# Patient Record
Sex: Male | Born: 1980 | Race: Black or African American | Hispanic: No | Marital: Single | Smoking: Never smoker
Health system: Southern US, Community
[De-identification: ages and names within clinical notes are randomized; demographics above are authoritative.]

## PROBLEM LIST (undated history)

## (undated) HISTORY — PX: LEG SURGERY: SHX1003

---

## 2005-05-10 ENCOUNTER — Emergency Department (HOSPITAL_COMMUNITY): Admission: EM | Admit: 2005-05-10 | Discharge: 2005-05-10 | Payer: Self-pay | Admitting: Emergency Medicine

## 2006-10-31 ENCOUNTER — Emergency Department (HOSPITAL_COMMUNITY): Admission: EM | Admit: 2006-10-31 | Discharge: 2006-10-31 | Payer: Self-pay | Admitting: Emergency Medicine

## 2007-03-15 ENCOUNTER — Inpatient Hospital Stay (HOSPITAL_COMMUNITY): Admission: EM | Admit: 2007-03-15 | Discharge: 2007-03-17 | Payer: Self-pay

## 2008-07-26 ENCOUNTER — Emergency Department (HOSPITAL_COMMUNITY): Admission: EM | Admit: 2008-07-26 | Discharge: 2008-07-26 | Payer: Self-pay | Admitting: Family Medicine
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
## 2010-10-02 LAB — RAPID URINE DRUG SCREEN, HOSP PERFORMED
Amphetamines: NOT DETECTED
Barbiturates: NOT DETECTED
Benzodiazepines: NOT DETECTED
Cocaine: NOT DETECTED

## 2010-10-02 LAB — I-STAT 8, (EC8 V) (CONVERTED LAB)
Acid-base deficit: 1
BUN: 12
Chloride: 106
HCT: 45
Operator id: 133351
Sodium: 140
TCO2: 25
pCO2, Ven: 38.9 — ABNORMAL LOW

## 2010-10-02 LAB — DIFFERENTIAL
Basophils Absolute: 0
Basophils Relative: 0
Eosinophils Absolute: 0
Monocytes Relative: 5

## 2010-10-02 LAB — PROTIME-INR: Prothrombin Time: 13.2

## 2010-10-02 LAB — CBC
HCT: 41.2
MCHC: 33.9
RDW: 13.8

## 2010-10-02 LAB — SAMPLE TO BLOOD BANK

## 2010-10-02 LAB — POCT I-STAT CREATININE: Creatinine, Ser: 1.2

## 2012-07-30 ENCOUNTER — Emergency Department (HOSPITAL_COMMUNITY): Payer: Self-pay

## 2012-07-30 ENCOUNTER — Emergency Department (INDEPENDENT_AMBULATORY_CARE_PROVIDER_SITE_OTHER)
Admission: EM | Admit: 2012-07-30 | Discharge: 2012-07-30 | Disposition: A | Discharge status: Home / Self Care | Payer: Self-pay | Source: Home / Self Care | Attending: Family Medicine | Admitting: Family Medicine

## 2012-07-30 ENCOUNTER — Encounter (HOSPITAL_COMMUNITY): Payer: Self-pay | Admitting: Emergency Medicine

## 2012-07-30 DIAGNOSIS — R0789 Other chest pain: Secondary | ICD-10-CM

## 2012-07-30 LAB — CBC WITH DIFFERENTIAL/PLATELET
Eosinophils Absolute: 0.3 10*3/uL (ref 0.0–0.7)
Hemoglobin: 14.5 g/dL (ref 13.0–17.0)
Lymphocytes Relative: 18 % (ref 12–46)
MCH: 27.2 pg (ref 26.0–34.0)
Monocytes Relative: 6 % (ref 3–12)
Neutro Abs: 5.8 10*3/uL (ref 1.7–7.7)
RBC: 5.34 MIL/uL (ref 4.22–5.81)

## 2012-07-30 MED ORDER — METHYLPREDNISOLONE 4 MG PO KIT: PACK | ORAL | Status: DC

## 2012-07-30 MED ORDER — HYDROCOD POLST-CHLORPHEN POLST 10-8 MG/5ML PO LQCR: 5.0000 mL | Freq: Two times a day (BID) | ORAL | Status: DC | PRN

## 2012-07-30 NOTE — ED Notes (Signed)
Patient states he has had chest pain since yesterday.  Admits to having SOB.   Denies any pain radiating.  No treatment tried.

## 2012-07-30 NOTE — ED Provider Notes (Signed)
Medical screening examination/treatment/procedure(s) were performed by resident physician or non-physician practitioner and as supervising physician I was immediately available for consultation/collaboration.   KINDL,JAMES DOUGLAS MD.   James D Kindl, MD 07/30/12 2031 

## 2012-07-30 NOTE — ED Provider Notes (Signed)
History    CSN: [id number] Arrival date & time 07/30/12  1530  First MD Initiated Contact with Patient 07/30/12 1620     Chief Complaint  Patient presents with  . Chest Pain   (Consider location/radiation/quality/duration/timing/severity/associated sxs/prior Treatment) Patient is a 32 y.o. male presenting with chest pain.  Chest Pain Associated symptoms: cough and shortness of breath   Associated symptoms: no diaphoresis, no fatigue and no fever       32 yo bm presents with a 2 day hx of cough, sob, and chest pain.  Denies fever, chills, headache sore throat, nausea, vomiting.  Nonproductive cough started first, then the chest pain.  No hx of asthma or cardiac issues.  Smokes recreational marijuana.   Symptoms not made worse with exertion.   History reviewed. No pertinent past medical history. No past surgical history on file. No family history on file. History  Substance Use Topics  . Smoking status: Not on file  . Smokeless tobacco: Not on file  . Alcohol Use: Not on file    Review of Systems  Constitutional: Negative for fever, chills, diaphoresis, activity change, appetite change and fatigue.  HENT: Negative.   Eyes: Negative.   Respiratory: Positive for cough, chest tightness and shortness of breath. Negative for wheezing and stridor.   Cardiovascular: Positive for chest pain.  Gastrointestinal: Negative.   Endocrine: Negative.   Genitourinary: Negative.   Musculoskeletal: Positive for myalgias (chest).  Skin: Negative.   Allergic/Immunologic: Negative.   Hematological: Negative.   Psychiatric/Behavioral: Negative.     Allergies  Review of patient's allergies indicates no known allergies.  Home Medications   Current Outpatient Rx  Name  Route  Sig  Dispense  Refill  . chlorpheniramine-HYDROcodone (TUSSIONEX PENNKINETIC ER) 10-8 MG/5ML LQCR   Oral   Take 5 mLs by mouth every 12 (twelve) hours as needed (cough).   140 mL   0   . methylPREDNISolone (MEDROL  DOSEPAK) 4 MG tablet      follow package directions   21 tablet   0    BP 125/77  Pulse 71  Temp(Src) 98 F (36.7 C) (Oral)  Resp 20  SpO2 96% Physical Exam  Constitutional: He is oriented to person, place, and time. He appears well-developed and well-nourished.  HENT:  Head: Normocephalic and atraumatic.  Eyes: Pupils are equal, round, and reactive to light.  Neck: Normal range of motion.  Cardiovascular: Normal rate, regular rhythm and normal heart sounds.   Pulmonary/Chest: Effort normal and breath sounds normal. No respiratory distress. He has no wheezes. He has no rales. He exhibits tenderness (sternal border and bilal pectoralis ).  Musculoskeletal: Normal range of motion.  Neurological: He is alert and oriented to person, place, and time.  Skin: Skin is warm and dry.  Psychiatric: He has a normal mood and affect.    ED Course  Procedures (including critical care time) Labs Reviewed  CBC WITH DIFFERENTIAL   Dg Chest 2 View  07/30/2012   *RADIOLOGY REPORT*  Clinical Data: Chest tightness and pain.  CHEST - 2 VIEW  Comparison: 10/31/2006 for  Findings: Trachea is midline.  Heart size stable.  There may be minimal scarring at the right lung base.  Lungs are otherwise clear.  No pleural fluid.  IMPRESSION: No acute findings.   Original Report Authenticated By: Leanna Battles, M.D.   1. Chest pain, muscular         Possible bronchitis  MDM  Patient given scripts for tussionex and medrol  dosepack to take as directed.  If symptoms not better or worsen within the next 5-7 days he will go to the ED for further evaluation.  Voices understanding.  All questions answered.  Reviewed with dr Artis Flock.    Meds ordered this encounter  Medications  . chlorpheniramine-HYDROcodone (TUSSIONEX PENNKINETIC ER) 10-8 MG/5ML LQCR    Sig: Take 5 mLs by mouth every 12 (twelve) hours as needed (cough).    Dispense:  140 mL    Refill:  0  . methylPREDNISolone (MEDROL DOSEPAK) 4 MG tablet     Sig: follow package directions    Dispense:  21 tablet    Refill:  0    Zonia Kief, PA-C 07/30/12 2015

## 2014-10-04 IMAGING — CR DG CHEST 2V
3 series · 3 of 3 positions shown · non-contrast
Comparison: 10/31/2006 for

CLINICAL DATA: Chest tightness and pain.

CHEST - 2 VIEW

[view not recorded (1 of 3)]
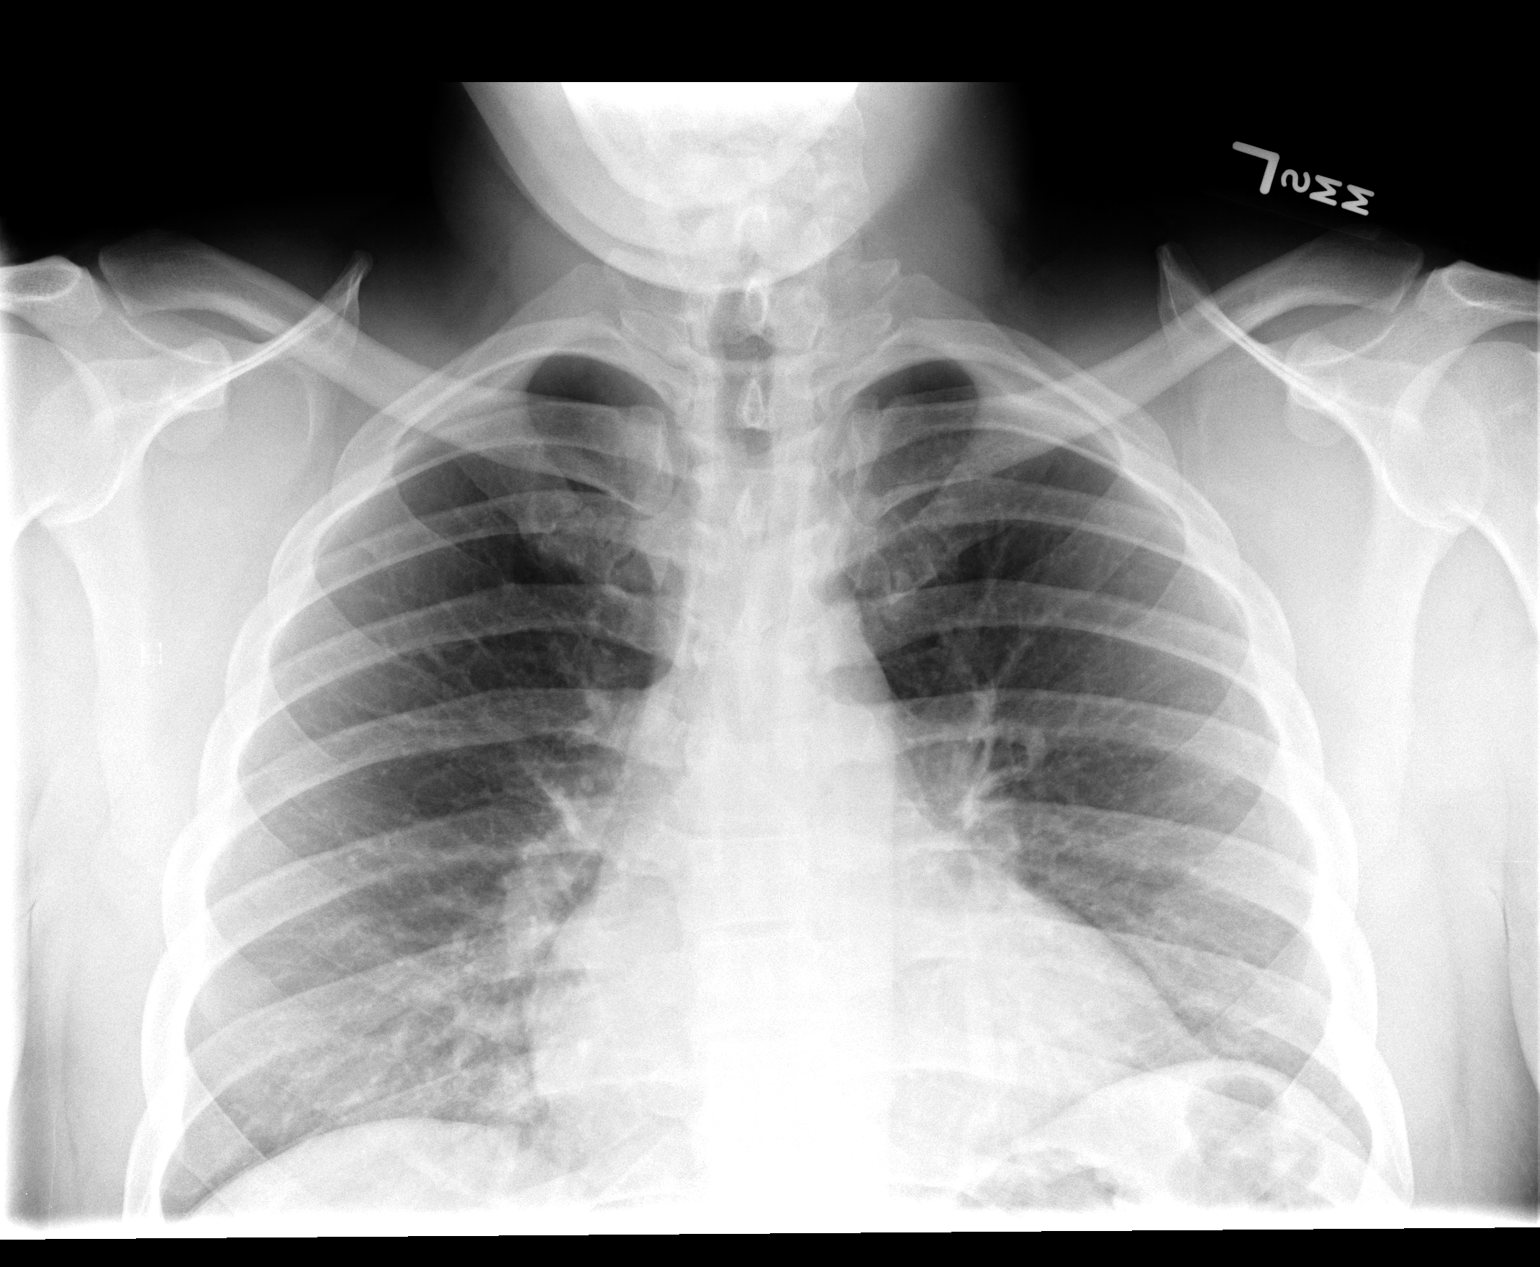

[view not recorded (2 of 3)]
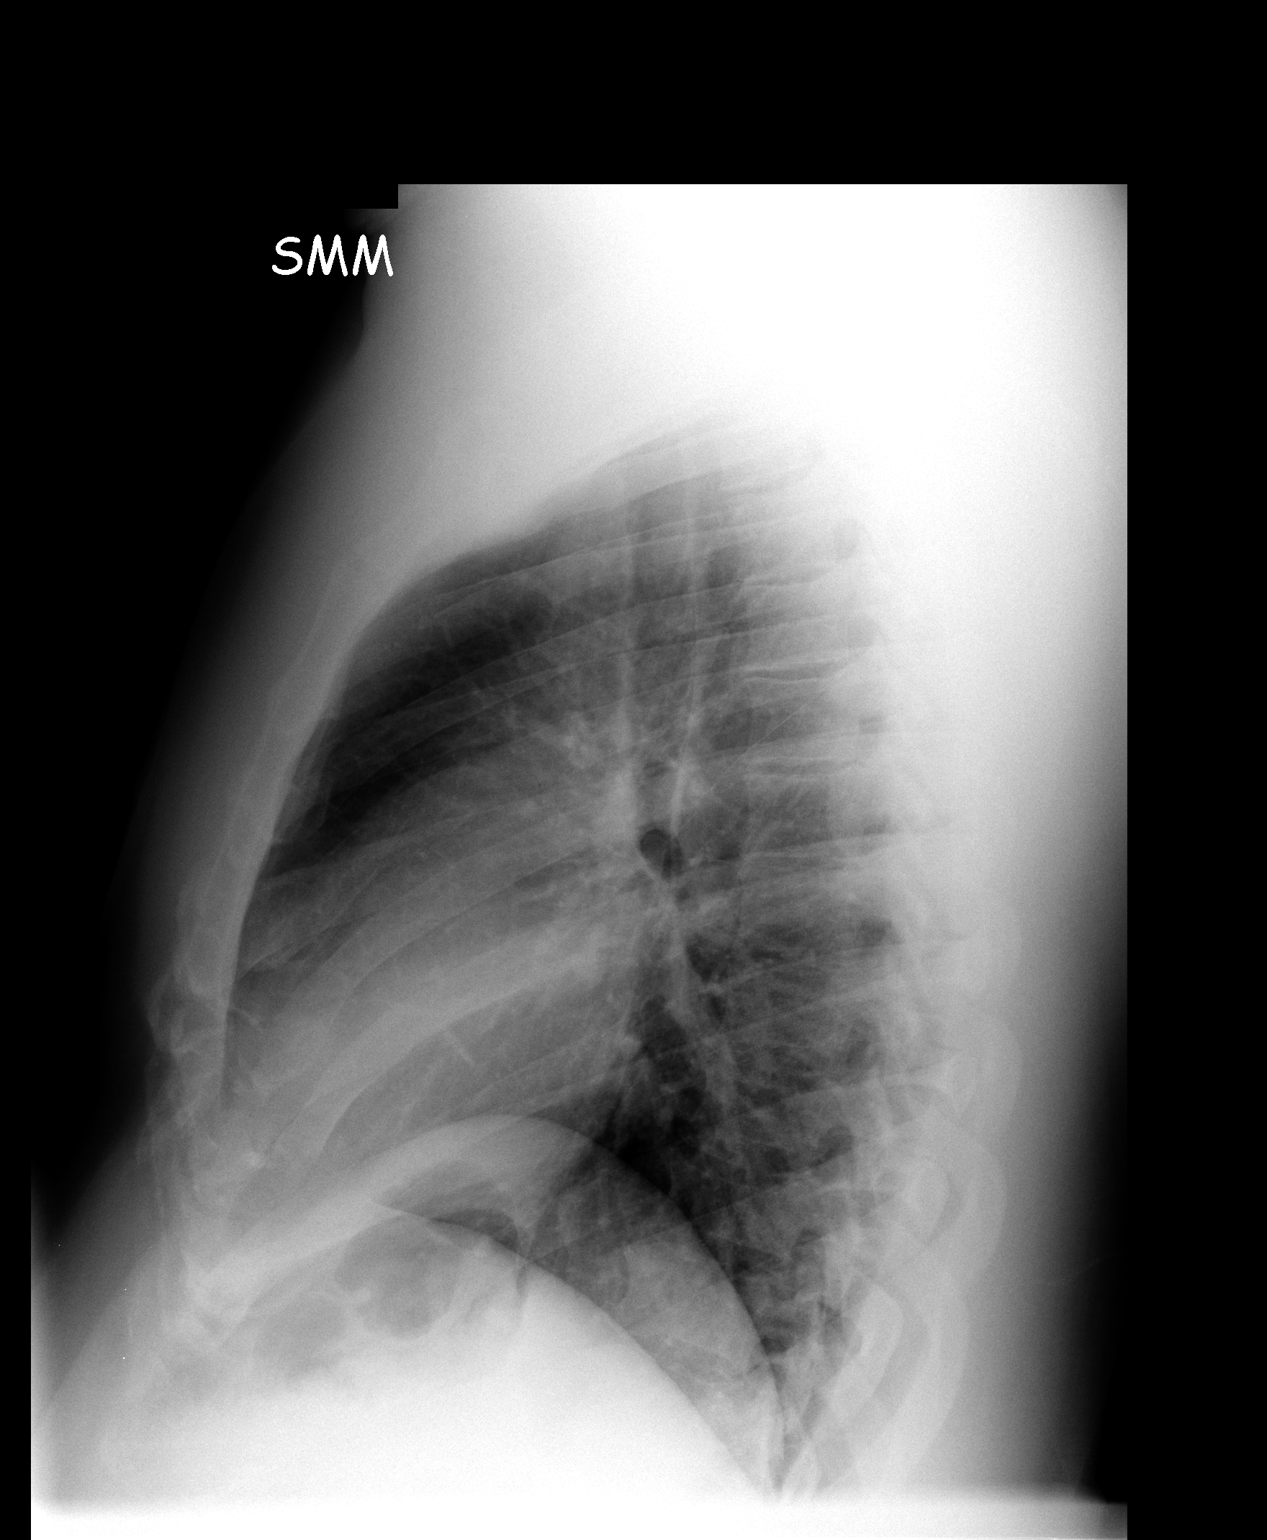

[view not recorded (3 of 3)]
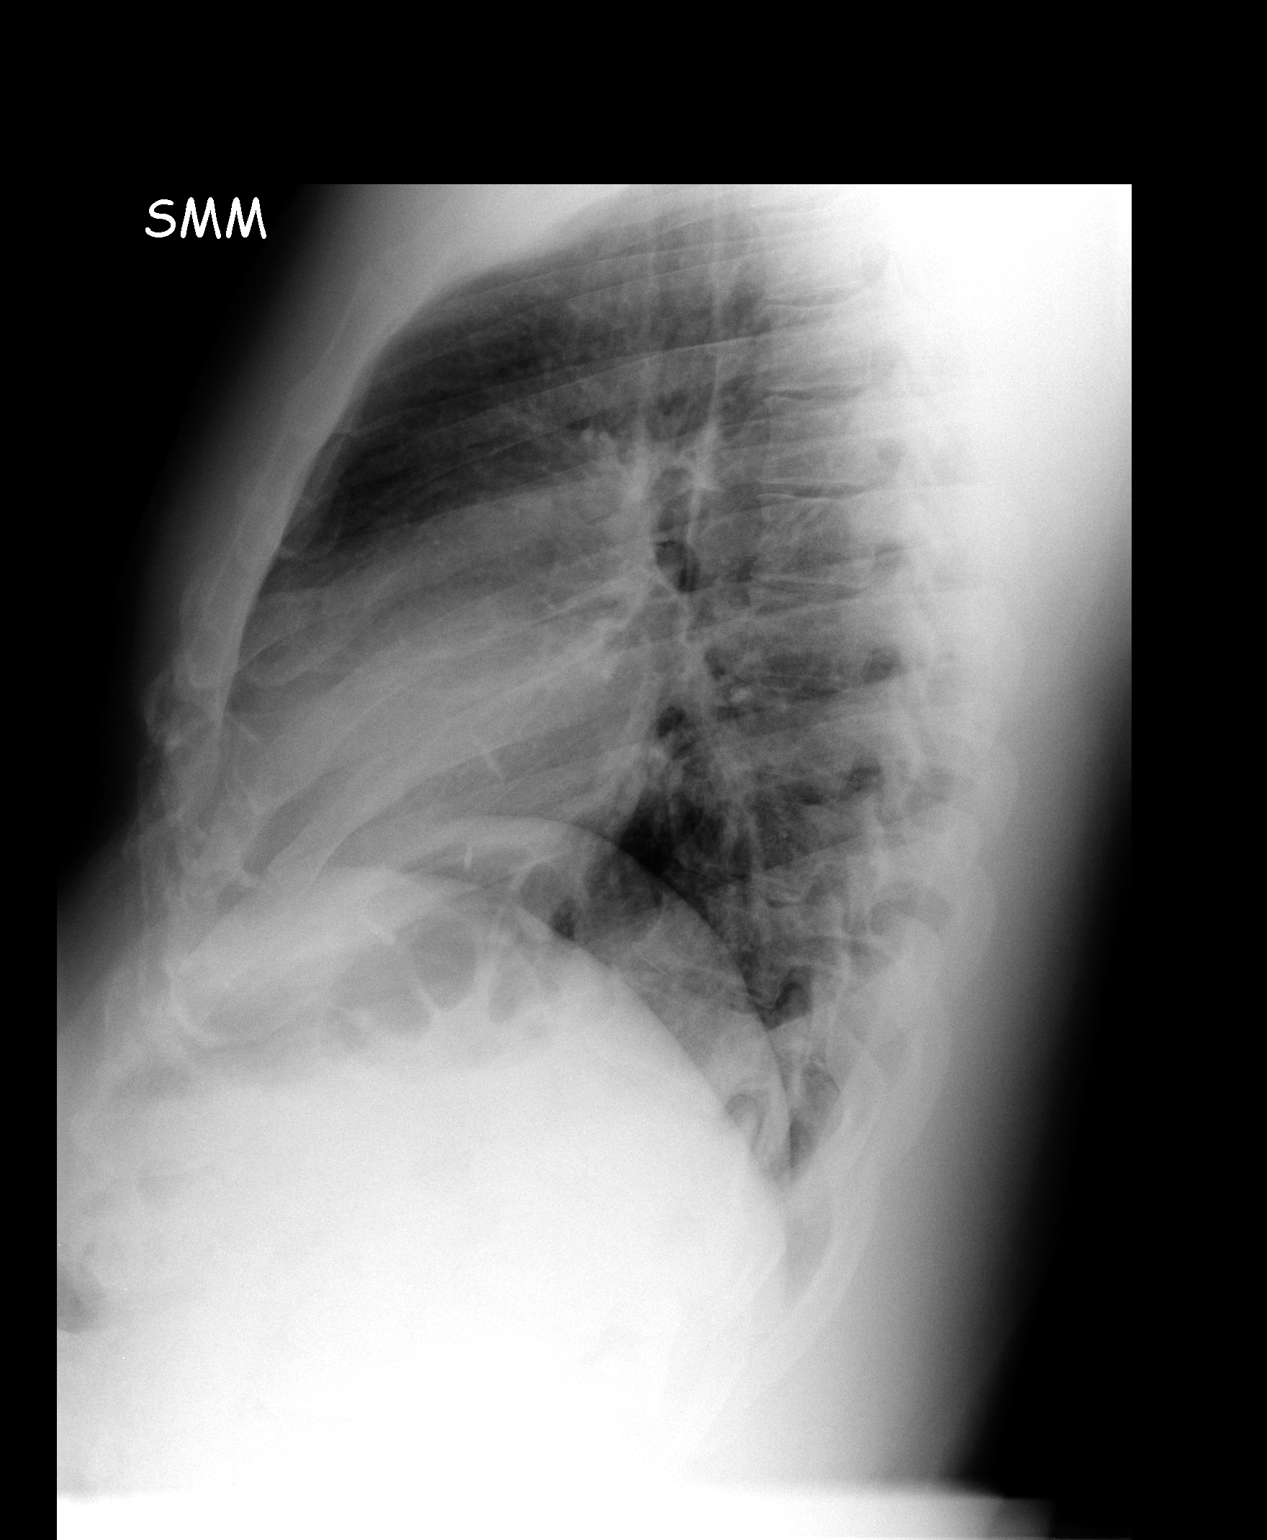

[3 of 3 positions shown; findings below may reference images not displayed]

FINDINGS: Trachea is midline.  Heart size stable.  There may be
minimal scarring at the right lung base.  Lungs are otherwise
clear.  No pleural fluid.
IMPRESSION: No acute findings.

## 2017-01-03 ENCOUNTER — Encounter (HOSPITAL_COMMUNITY): Payer: Self-pay | Admitting: Emergency Medicine

## 2017-01-03 ENCOUNTER — Ambulatory Visit (HOSPITAL_COMMUNITY)
Admission: EM | Admit: 2017-01-03 | Discharge: 2017-01-03 | Disposition: A | Discharge status: Home / Self Care | Payer: Self-pay | Attending: Internal Medicine | Admitting: Internal Medicine

## 2017-01-03 ENCOUNTER — Other Ambulatory Visit: Payer: Self-pay

## 2017-01-03 DIAGNOSIS — R300 Dysuria: Secondary | ICD-10-CM | POA: Insufficient documentation

## 2017-01-03 DIAGNOSIS — Z79899 Other long term (current) drug therapy: Secondary | ICD-10-CM | POA: Insufficient documentation

## 2017-01-03 DIAGNOSIS — Z202 Contact with and (suspected) exposure to infections with a predominantly sexual mode of transmission: Secondary | ICD-10-CM | POA: Insufficient documentation

## 2017-01-03 DIAGNOSIS — R369 Urethral discharge, unspecified: Secondary | ICD-10-CM | POA: Insufficient documentation

## 2017-01-03 DIAGNOSIS — Z113 Encounter for screening for infections with a predominantly sexual mode of transmission: Secondary | ICD-10-CM

## 2017-01-03 DIAGNOSIS — Z711 Person with feared health complaint in whom no diagnosis is made: Secondary | ICD-10-CM

## 2017-01-03 LAB — POCT URINALYSIS DIP (DEVICE)
Bilirubin Urine: NEGATIVE
GLUCOSE, UA: NEGATIVE mg/dL
Ketones, ur: NEGATIVE mg/dL
NITRITE: NEGATIVE
Protein, ur: NEGATIVE mg/dL
Specific Gravity, Urine: 1.015 (ref 1.005–1.030)
UROBILINOGEN UA: 0.2 mg/dL (ref 0.0–1.0)
pH: 5.5 (ref 5.0–8.0)

## 2017-01-03 MED ORDER — AZITHROMYCIN 250 MG PO TABS
ORAL_TABLET | ORAL | Status: AC
  Filled 2017-01-03: qty 4

## 2017-01-03 MED ORDER — CEFTRIAXONE SODIUM 250 MG IJ SOLR
250.0000 mg | Freq: Once | INTRAMUSCULAR | Status: AC
  Administered 2017-01-03: 250 mg via INTRAMUSCULAR

## 2017-01-03 MED ORDER — CEFTRIAXONE SODIUM 250 MG IJ SOLR
INTRAMUSCULAR | Status: AC
Start: 2017-01-03 — End: 2017-01-03
  Filled 2017-01-03: qty 250

## 2017-01-03 MED ORDER — AZITHROMYCIN 250 MG PO TABS
1000.0000 mg | ORAL_TABLET | Freq: Once | ORAL | Status: AC
  Administered 2017-01-03: 1000 mg via ORAL
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
## 2017-01-03 NOTE — Discharge Instructions (Signed)
We have treated you today for gonorrhea and chlamydia, with rocephin and azithromycin.  We are testing you for Gonorrhea, Chlamydia, Trichomonas. We will call you if anything is positive and let you know if you require any further treatment. Refrain from unprotected sex for 1 week.   Please return if symptoms not improving with treatment, development of fever, nausea, vomiting, abdominal pain.

## 2017-01-03 NOTE — ED Notes (Signed)
Sent to the bathroom for a dirty and clean urine with instruction

## 2017-01-03 NOTE — ED Triage Notes (Signed)
penile discharge noticed christmas day, started having pain last night

## 2017-01-04 LAB — URINE CYTOLOGY ANCILLARY ONLY
Chlamydia: NEGATIVE
Neisseria Gonorrhea: POSITIVE — AB
Trichomonas: NEGATIVE

## 2017-01-05 LAB — URINE CULTURE: Culture: NO GROWTH

## 2017-01-07 ENCOUNTER — Telehealth (HOSPITAL_COMMUNITY): Payer: Self-pay | Admitting: *Deleted

## 2018-10-25 ENCOUNTER — Emergency Department (HOSPITAL_COMMUNITY)
Admission: EM | Admit: 2018-10-25 | Discharge: 2018-10-25 | Disposition: A | Discharge status: Home / Self Care | Payer: Self-pay | Attending: Emergency Medicine | Admitting: Emergency Medicine

## 2018-10-25 ENCOUNTER — Encounter (HOSPITAL_COMMUNITY): Payer: Self-pay | Admitting: Emergency Medicine

## 2018-10-25 ENCOUNTER — Other Ambulatory Visit: Payer: Self-pay

## 2018-10-25 DIAGNOSIS — Y929 Unspecified place or not applicable: Secondary | ICD-10-CM | POA: Insufficient documentation

## 2018-10-25 DIAGNOSIS — Y999 Unspecified external cause status: Secondary | ICD-10-CM | POA: Insufficient documentation

## 2018-10-25 DIAGNOSIS — S39012A Strain of muscle, fascia and tendon of lower back, initial encounter: Secondary | ICD-10-CM | POA: Insufficient documentation

## 2018-10-25 DIAGNOSIS — X58XXXA Exposure to other specified factors, initial encounter: Secondary | ICD-10-CM | POA: Insufficient documentation

## 2018-10-25 DIAGNOSIS — Y939 Activity, unspecified: Secondary | ICD-10-CM | POA: Insufficient documentation

## 2018-10-25 MED ORDER — CYCLOBENZAPRINE HCL 10 MG PO TABS: 10.0000 mg | ORAL_TABLET | Freq: Two times a day (BID) | ORAL | 0 refills | Status: AC | PRN

## 2018-10-25 MED ORDER — IBUPROFEN 600 MG PO TABS: 600.0000 mg | ORAL_TABLET | Freq: Four times a day (QID) | ORAL | 0 refills | Status: AC | PRN

## 2018-10-25 NOTE — ED Triage Notes (Signed)
Patient here from home with complaints of right sided flank pain radiating around to back. Denies urinary symptoms.
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
## 2018-11-26 ENCOUNTER — Encounter: Payer: Self-pay | Admitting: Physician Assistant

## 2018-11-26 ENCOUNTER — Ambulatory Visit
Admission: EM | Admit: 2018-11-26 | Discharge: 2018-11-26 | Disposition: A | Discharge status: Home / Self Care | Payer: HRSA Program | Attending: Physician Assistant | Admitting: Physician Assistant

## 2018-11-26 DIAGNOSIS — Z20822 Contact with and (suspected) exposure to covid-19: Secondary | ICD-10-CM

## 2018-11-26 DIAGNOSIS — Z20828 Contact with and (suspected) exposure to other viral communicable diseases: Secondary | ICD-10-CM | POA: Diagnosis not present
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
## 2018-11-26 NOTE — Discharge Instructions (Addendum)
COVID testing ordered. As discussed, given recent exposure without symptoms, you may still be in incubation period. Monitor for any symptoms such as cough, congestion, shortness of breath, loss of taste/smell, fever, to start self quarantine and may need retesting. Go to the emergency department for further evaluation if you develop significant shortness of breath, cannot speak in full sentences.  
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
## 2018-11-28 LAB — NOVEL CORONAVIRUS, NAA: SARS-CoV-2, NAA: NOT DETECTED
